# Patient Record
Sex: Male | Born: 1975 | Race: White | Hispanic: No | Marital: Married | Smoking: Current some day smoker
Health system: Southern US, Community
[De-identification: ages and names within clinical notes are randomized; demographics above are authoritative.]

## PROBLEM LIST (undated history)

## (undated) DIAGNOSIS — F329 Major depressive disorder, single episode, unspecified: Secondary | ICD-10-CM

## (undated) DIAGNOSIS — F32A Depression, unspecified: Secondary | ICD-10-CM

---

## 2017-03-01 ENCOUNTER — Encounter (HOSPITAL_BASED_OUTPATIENT_CLINIC_OR_DEPARTMENT_OTHER): Payer: Self-pay | Admitting: Emergency Medicine

## 2017-03-01 ENCOUNTER — Other Ambulatory Visit: Payer: Self-pay

## 2017-03-01 ENCOUNTER — Emergency Department (HOSPITAL_BASED_OUTPATIENT_CLINIC_OR_DEPARTMENT_OTHER): Payer: BLUE CROSS/BLUE SHIELD

## 2017-03-01 ENCOUNTER — Emergency Department (HOSPITAL_BASED_OUTPATIENT_CLINIC_OR_DEPARTMENT_OTHER)
Admission: EM | Admit: 2017-03-01 | Discharge: 2017-03-01 | Disposition: A | Discharge status: Home / Self Care | Payer: BLUE CROSS/BLUE SHIELD | Attending: Emergency Medicine | Admitting: Emergency Medicine

## 2017-03-01 DIAGNOSIS — S0990XA Unspecified injury of head, initial encounter: Secondary | ICD-10-CM | POA: Diagnosis present

## 2017-03-01 DIAGNOSIS — Y999 Unspecified external cause status: Secondary | ICD-10-CM | POA: Diagnosis not present

## 2017-03-01 DIAGNOSIS — F17200 Nicotine dependence, unspecified, uncomplicated: Secondary | ICD-10-CM | POA: Diagnosis not present

## 2017-03-01 DIAGNOSIS — W2209XA Striking against other stationary object, initial encounter: Secondary | ICD-10-CM | POA: Insufficient documentation

## 2017-03-01 DIAGNOSIS — H538 Other visual disturbances: Secondary | ICD-10-CM | POA: Insufficient documentation

## 2017-03-01 DIAGNOSIS — Z79899 Other long term (current) drug therapy: Secondary | ICD-10-CM | POA: Diagnosis not present

## 2017-03-01 DIAGNOSIS — S060X0A Concussion without loss of consciousness, initial encounter: Secondary | ICD-10-CM | POA: Insufficient documentation

## 2017-03-01 DIAGNOSIS — Y939 Activity, unspecified: Secondary | ICD-10-CM | POA: Insufficient documentation

## 2017-03-01 DIAGNOSIS — Y929 Unspecified place or not applicable: Secondary | ICD-10-CM | POA: Diagnosis not present

## 2017-03-01 HISTORY — DX: Major depressive disorder, single episode, unspecified: F32.9

## 2017-03-01 HISTORY — DX: Depression, unspecified: F32.A

## 2017-03-01 MED ORDER — DEXAMETHASONE SODIUM PHOSPHATE 10 MG/ML IJ SOLN
10.0000 mg | Freq: Once | INTRAMUSCULAR | Status: AC
  Administered 2017-03-01: 10 mg via INTRAMUSCULAR
  Filled 2017-03-01: qty 1

## 2017-03-01 MED ORDER — KETOROLAC TROMETHAMINE 60 MG/2ML IM SOLN
60.0000 mg | Freq: Once | INTRAMUSCULAR | Status: AC
  Administered 2017-03-01: 60 mg via INTRAMUSCULAR
  Filled 2017-03-01: qty 2

## 2017-03-01 NOTE — ED Provider Notes (Signed)
[facility] EMERGENCY DEPARTMENT Provider Note   CSN: [id number] Arrival date & time: 03/01/17  1345  History   Chief Complaint Chief Complaint  Patient presents with  . Headache    HPI Tanner Salazar is a 41 y.o. male.  HPI   Patient to the ER with complaints of headache and blurry vision since Saturday. He was bending over to get something but when he stood up he hit the back of his head on the counter. He did not have loc but his vision did go black briefly. He stayed in bed most of Sunday, he missed work yesterday because of a migraine and then today he is having photophobia and continued headache.  He has no hx of head injury, denies laceration, confusion, falling    Past Medical History:  Diagnosis Date  . Depression     There are no active problems to display for this patient.   History reviewed. No pertinent surgical history.     Home Medications    Prior to Admission medications   Medication Sig Start Date End Date Taking? Authorizing Provider  clonazePAM (KLONOPIN) 1 MG tablet Take 1 mg by mouth 2 (two) times daily.   Yes [provider]  venlafaxine (EFFEXOR) 100 MG tablet Take 100 mg by mouth 2 (two) times daily.   Yes [provider]    Family History No family history on file.  Social History Social History   Tobacco Use  . Smoking status: Current Some Day Smoker  . Smokeless tobacco: Never Used  Substance Use Topics  . Alcohol use: Yes    Frequency: Never  . Drug use: No     Allergies   Patient has no known allergies.   Review of Systems Review of Systems The patient denies anorexia, fever, weight loss,, vision loss, decreased hearing, hoarseness, chest pain, syncope, dyspnea on exertion, peripheral edema, balance deficits, hemoptysis, abdominal pain, melena, hematochezia, severe indigestion/heartburn, hematuria, incontinence, genital sores, muscle weakness, suspicious skin lesions, transient blindness,  difficulty walking, depression, unusual weight change, abnormal bleeding, enlarged lymph nodes, angioedema, and breast masses.   Physical Exam Updated Vital Signs BP 120/85 (BP Location: Left Arm)   Pulse 69   Temp 98.2 F (36.8 C) (Oral)   Resp 16   SpO2 100%   Physical Exam  Constitutional: He appears well-developed and well-nourished. No distress.  HENT:  Head: Normocephalic and atraumatic.  Eyes: Conjunctivae and EOM are normal. Pupils are equal, round, and reactive to light. No scleral icterus.  Neck: Normal range of motion. Neck supple.  Cardiovascular: Normal rate, regular rhythm, normal heart sounds and intact distal pulses.  Pulmonary/Chest: Effort normal. No respiratory distress. He has no wheezes. He has no rales. He exhibits no tenderness.  Abdominal: Soft. There is no tenderness.  Musculoskeletal: He exhibits no edema or tenderness.  Neurological: He is alert. He displays a negative Romberg sign.  No facial paralysis or slurring of speech, patient is alert and oriented to self. Unable to asses cranial nerves d/t pts inability to follow commands.    Skin: Skin is warm and dry. No petechiae, no purpura and no rash noted. He is not diaphoretic.  Psychiatric: His speech is not slurred. Cognition and memory are not impaired.  Nursing note and vitals reviewed.    ED Treatments / Results  Labs (all labs ordered are listed, but only abnormal results are displayed) Labs Reviewed - No data to display  EKG  EKG Interpretation None  Radiology Ct Head Wo Contrast  Result Date: 03/01/2017 CLINICAL DATA:  Frontal headache with photophobia after hitting head on cabinet this past weekend. EXAM: CT HEAD WITHOUT CONTRAST TECHNIQUE: Contiguous axial images were obtained from the base of the skull through the vertex without intravenous contrast. COMPARISON:  None. FINDINGS: Brain: No evidence of acute infarction, hemorrhage, hydrocephalus, extra-axial collection or mass  lesion/mass effect. Normal variant cavum septi pellucidi accounting for the asymmetric appearance of the lateral ventricles. Vascular: No hyperdense vessel or unexpected calcification. Skull: Normal. Negative for fracture or focal lesion. Sinuses/Orbits: No acute finding. Other: None IMPRESSION: No acute intracranial abnormality. Electronically Signed   By: Tollie Ethavid  Kwon M.D.   On: 03/01/2017 16:08    Procedures Procedures (including critical care time)  Medications Ordered in ED Medications  dexamethasone (DECADRON) injection 10 mg (10 mg Intramuscular Given 03/01/17 1454)  ketorolac (TORADOL) injection 60 mg (60 mg Intramuscular Given 03/01/17 1454)     Initial Impression / Assessment and Plan / ED Course  I have reviewed the triage vital signs and the nursing notes.  Pertinent labs & imaging results that were available during my care of the patient were reviewed by me and considered in my medical decision making (see chart for details).    2:50pm Tanner NeedleMichael is having a post traumatic headache, he is 4 days out and likely has a mild/moderate concussion but due to the photophobia and continued migraine I will obtain a head CT. Decadron and Toradol injections given in ED. Visual Acuity test ordered.  4:18pm Normal Head Ct, clinically he has a concussion. No driving and no operating heavy machinery for now. Work note given.  Aspirin, Tylenol or Motrin for pain. Rest  I will give follow-up information for neurology if his symptoms persist past Monday he has been instructed to call and schedule an appointment.  Blood pressure 120/85, pulse 69, temperature 98.2 F (36.8 C), temperature source Oral, resp. rate 16, SpO2 100 %.  Tanner Salazar has been evaluated today in the emergency department. The appropriate screening and testing was been performed and I believe the patient to be medically stable for discharge.   Return signs and symptoms have been discussed with the patient and/or  caregivers and they have voiced their understanding. The patient has agreed to follow-up with their primary care provider or the referred specialist.   Final Clinical Impressions(s) / ED Diagnoses   Final diagnoses:  Concussion without loss of consciousness, initial encounter    ED Discharge Orders    None       Marlon PelGreene, Adeleigh Barletta, PA-C 03/01/17 1619    Loren RacerYelverton, David, MD 03/02/17 1345

## 2017-03-01 NOTE — ED Triage Notes (Signed)
Pt reports hitting head on a cabinet on Saturday. Reports today he is having a frontal headache with photophobia.

## 2019-03-31 ENCOUNTER — Other Ambulatory Visit: Payer: Self-pay

## 2019-03-31 ENCOUNTER — Emergency Department (INDEPENDENT_AMBULATORY_CARE_PROVIDER_SITE_OTHER): Payer: BC Managed Care – PPO

## 2019-03-31 ENCOUNTER — Emergency Department
Admission: EM | Admit: 2019-03-31 | Discharge: 2019-03-31 | Disposition: A | Discharge status: Home / Self Care | Payer: BC Managed Care – PPO | Source: Home / Self Care | Attending: Family Medicine | Admitting: Family Medicine

## 2019-03-31 DIAGNOSIS — M25532 Pain in left wrist: Secondary | ICD-10-CM | POA: Diagnosis not present

## 2019-03-31 DIAGNOSIS — M778 Other enthesopathies, not elsewhere classified: Secondary | ICD-10-CM

## 2019-03-31 DIAGNOSIS — M654 Radial styloid tenosynovitis [de Quervain]: Secondary | ICD-10-CM

## 2019-03-31 MED ORDER — PREDNISONE 20 MG PO TABS: ORAL_TABLET | ORAL | 0 refills | Status: AC

## 2019-03-31 NOTE — ED Provider Notes (Signed)
[facility]    CSN: [id number] Arrival date & time: 03/31/19  1048      History   Chief Complaint No chief complaint on file.   HPI Tanner Salazar is a 43 y.o. male.   Patient awoke yesterday morning with pain in his let wrist.  He recalls no injury.  The history is provided by the patient.  Wrist Pain This is a new problem. The current episode started yesterday. The problem occurs constantly. The problem has not changed since onset.Exacerbated by: wrist movement. Nothing relieves the symptoms. Treatments tried: ice pack. The treatment provided mild relief.    Past Medical History:  Diagnosis Date  . Depression     There are no problems to display for this patient.   History reviewed. No pertinent surgical history.     Home Medications    Prior to Admission medications   Medication Sig Start Date End Date Taking? Authorizing Provider  clonazePAM (KLONOPIN) 1 MG tablet Take 1 mg by mouth 2 (two) times daily.    [provider]  predniSONE (DELTASONE) 20 MG tablet Take one tab by mouth twice daily for 4 days, then one daily. Take with food. 03/31/19   Tanner Haw, MD  venlafaxine (EFFEXOR) 100 MG tablet Take 100 mg by mouth 2 (two) times daily.    [provider]    Family History History reviewed. No pertinent family history.  Social History Social History   Tobacco Use  . Smoking status: Current Some Day Smoker  . Smokeless tobacco: Never Used  Substance Use Topics  . Alcohol use: Yes  . Drug use: No     Allergies   Patient has no known allergies.   Review of Systems Review of Systems  All other systems reviewed and are negative.    Physical Exam Triage Vital Signs ED Triage Vitals [03/31/19 1310]  Enc Vitals Group     BP (!) 139/95     Pulse Rate 89     Resp 18     Temp 98.4 F (36.9 C)     Temp Source Oral     SpO2 98 %     Weight 200 lb (90.7 kg)     Height 5\' 9"  (1.753 m)     Head Circumference       Peak Flow      Pain Score 5     Pain Loc      Pain Edu?      Excl. in GC?    No data found.  Updated Vital Signs BP (!) 139/95 (BP Location: Right Arm)   Pulse 89   Temp 98.4 F (36.9 C) (Oral)   Resp 18   Ht 5\' 9"  (1.753 m)   Wt 90.7 kg   SpO2 98%   BMI 29.53 kg/m   Visual Acuity Right Eye Distance:   Left Eye Distance:   Bilateral Distance:    Right Eye Near:   Left Eye Near:    Bilateral Near:     Physical Exam Vitals and nursing note reviewed.  Constitutional:      General: He is not in acute distress. HENT:     Head: Normocephalic.     Nose: Nose normal.  Eyes:     Pupils: Pupils are equal, round, and reactive to light.  Cardiovascular:     Rate and Rhythm: Normal rate.  Pulmonary:     Effort: Pulmonary effort is normal.  Musculoskeletal:     Left wrist:  Tenderness present. No swelling, bony tenderness, snuff box tenderness or crepitus. Normal range of motion.       Hands:     Comments: Left wrist has tenderness to palpation over extensor tendons dorsally.  Pain is elicited by resisted finger extension.  Left thumb has distinct tenderness over the extensor tendons.  Palpation there during resisted extension and abduction of the thumb recreates his pain.  Distal neurovascular function is intact.    Skin:    General: Skin is warm and dry.  Neurological:     Mental Status: He is alert.      UC Treatments / Results  Labs (all labs ordered are listed, but only abnormal results are displayed) Labs Reviewed - No data to display  EKG   Radiology DG Wrist Complete Left  Result Date: 03/31/2019 CLINICAL DATA:  Wrist pain with dorsal soft tissue lump.  No injury. EXAM: LEFT WRIST - COMPLETE 3+ VIEW COMPARISON:  None. FINDINGS: There is no evidence of fracture or dislocation. There is no evidence of arthropathy or other focal bone abnormality. Soft tissues are unremarkable. IMPRESSION: Negative. Electronically Signed   By: Tanner Salazar M.D.   On:  03/31/2019 13:04    Procedures Procedures (including critical care time)  Medications Ordered in UC Medications - No data to display  Initial Impression / Assessment and Plan / UC Course  I have reviewed the triage vital signs and the nursing notes.  Pertinent labs & imaging results that were available during my care of the patient were reviewed by me and considered in my medical decision making (see chart for details).    Dispensed wrist splint.  Begin prednisone burst/taper. Followup with Dr. Aundria Salazar ([facility]) if not improving about two weeks.    Final Clinical Impressions(s) / UC Diagnoses   Final diagnoses:  Left wrist tendonitis  De Quervain's tenosynovitis, left     Discharge Instructions     Apply ice pack for 20 to 30 minutes, 3 to 4 times daily  Continue until pain and swelling decrease.  Wear splint for about 7 to 10 days.  May take Tylenol as needed for pain.  After finishing prednisone, may begin Ibuprofen 200mg , 4 tabs every 8 hours with food.  Begin range of motion and stretching exercises as tolerated.    ED Prescriptions    Medication Sig Dispense Auth. Provider   predniSONE (DELTASONE) 20 MG tablet Take one tab by mouth twice daily for 4 days, then one daily. Take with food. 12 tablet Tanner Nicolas, MD        Tanner Nicolas, MD 04/10/19 [phone]

## 2019-03-31 NOTE — Discharge Instructions (Addendum)
Apply ice pack for 20 to 30 minutes, 3 to 4 times daily  Continue until pain and swelling decrease.  Wear splint for about 7 to 10 days.  May take Tylenol as needed for pain.  After finishing prednisone, may begin Ibuprofen 200mg , 4 tabs every 8 hours with food.  Begin range of motion and stretching exercises as tolerated.

## 2019-03-31 NOTE — ED Triage Notes (Signed)
Woke up yesterday with Lt wrist pain and swelling. Icing as needed. Started yesterday. Ltd ROM. Pain 5/10

## 2020-06-19 IMAGING — DX DG WRIST COMPLETE 3+V*L*
4 series · 4 of 4 positions shown · non-contrast
Comparison: None.

CLINICAL DATA: Wrist pain with dorsal soft tissue lump.  No injury.

EXAM:
LEFT WRIST - COMPLETE 3+ VIEW

[wrist pa]
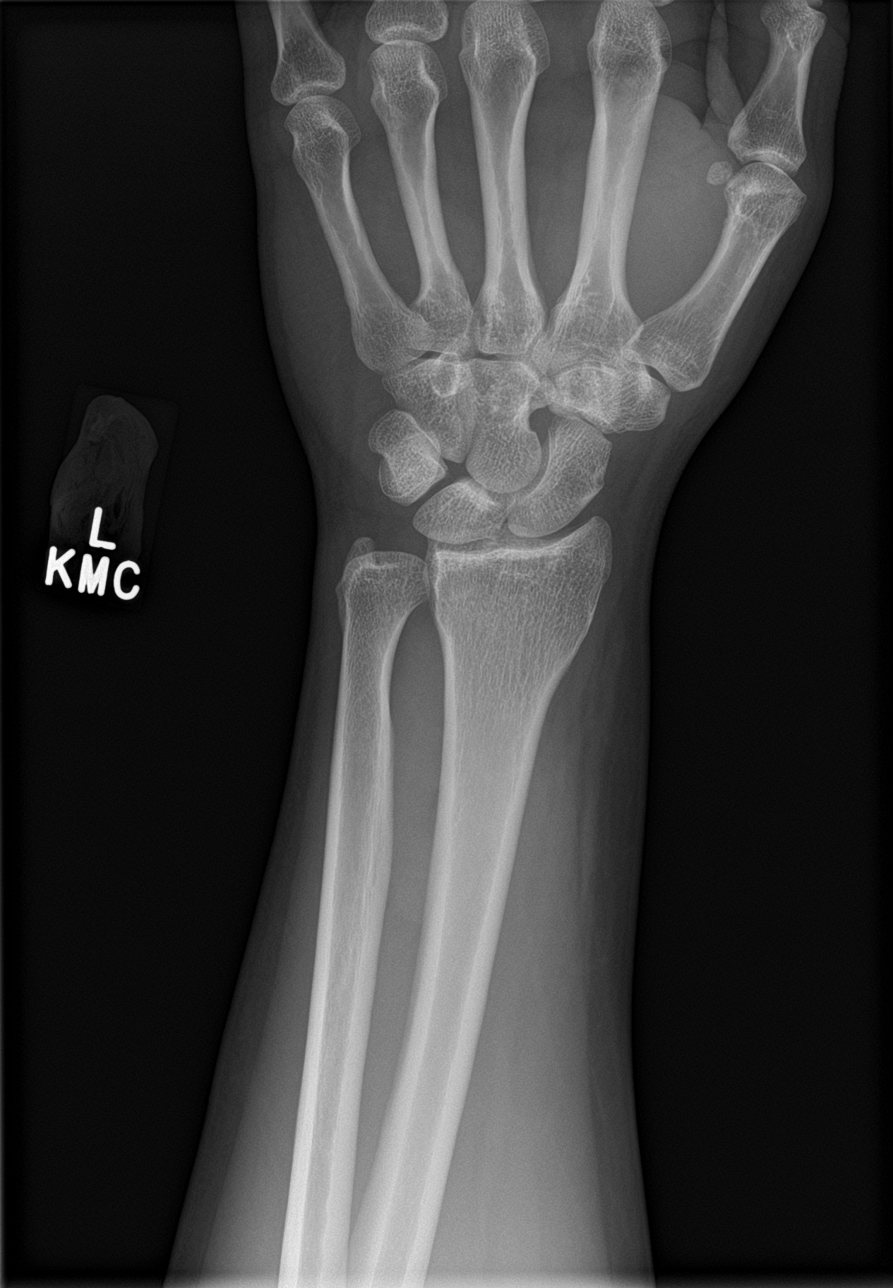

[wrist obl]
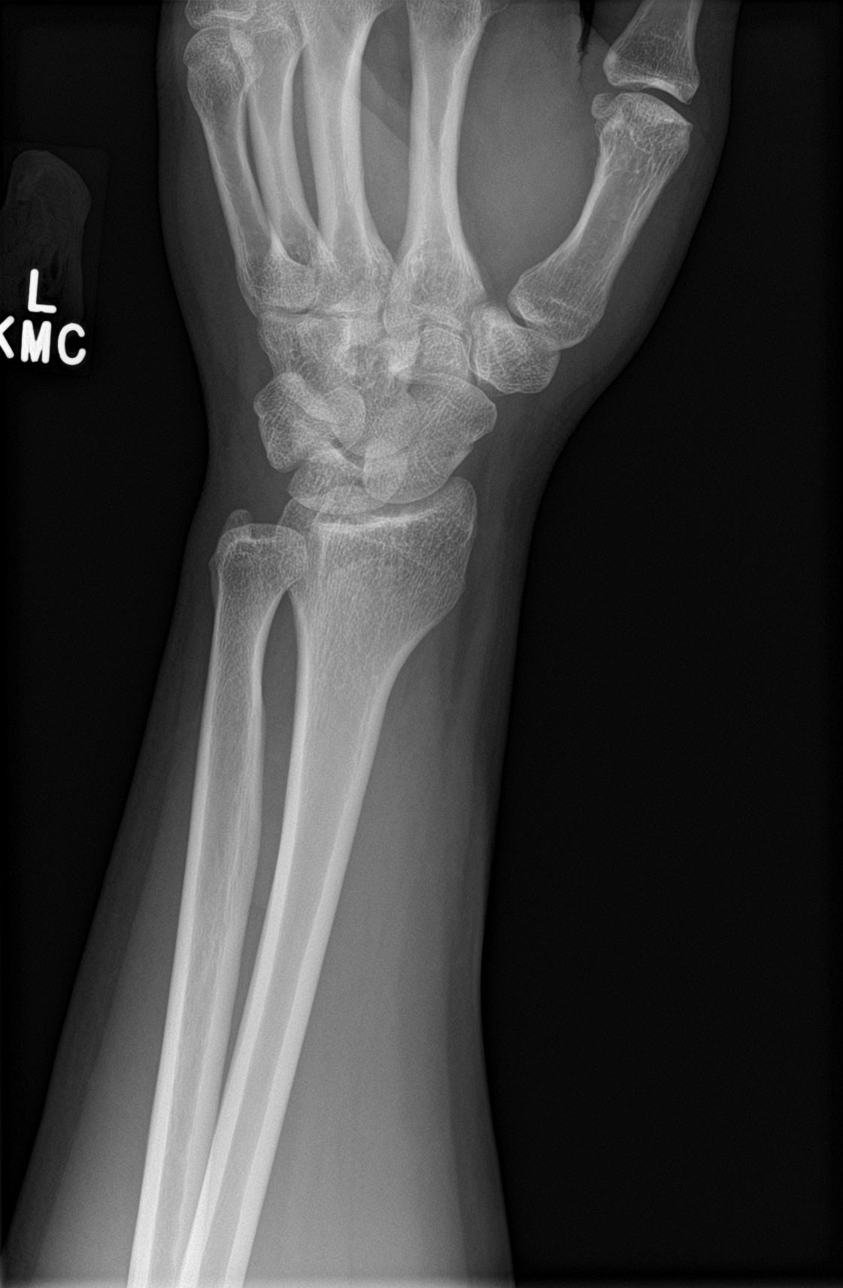

[wrist lat]
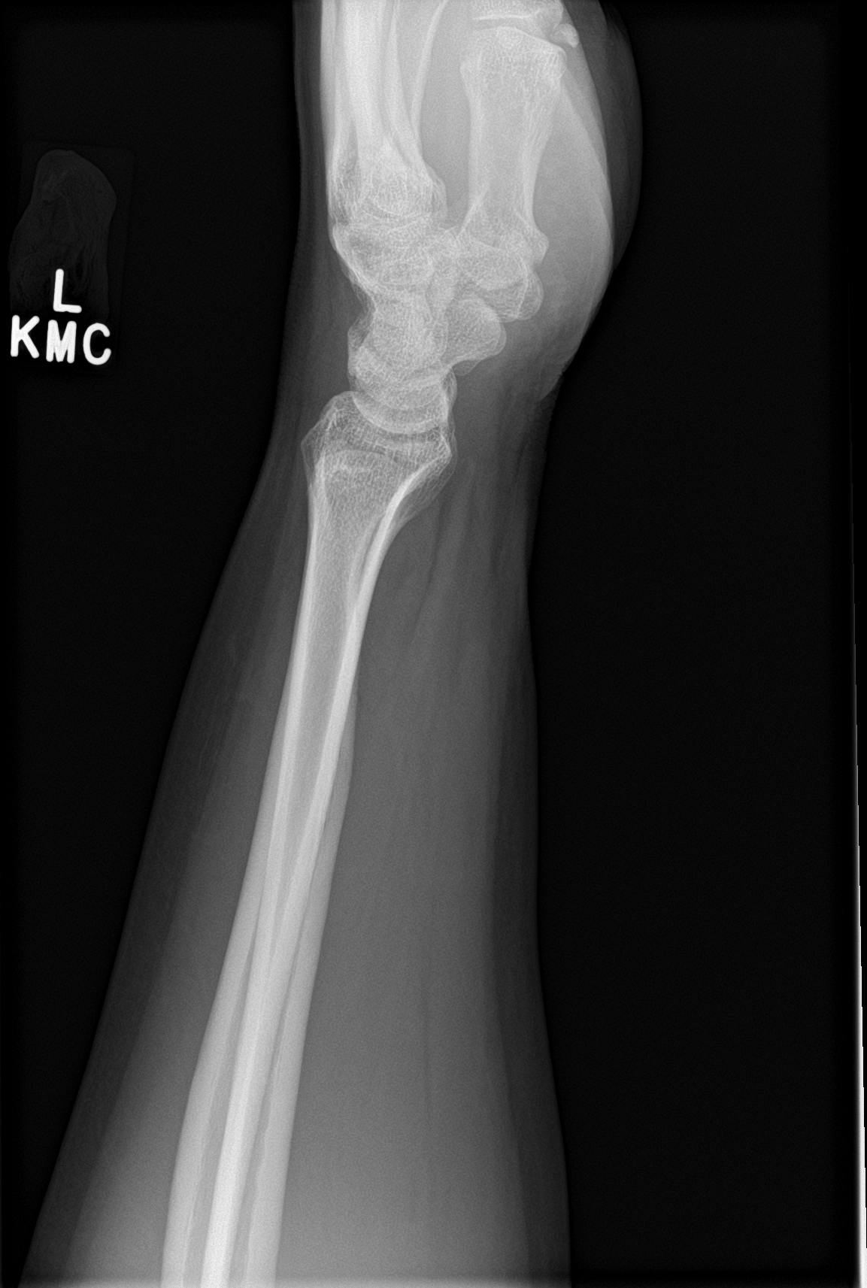

[wrist navicular]
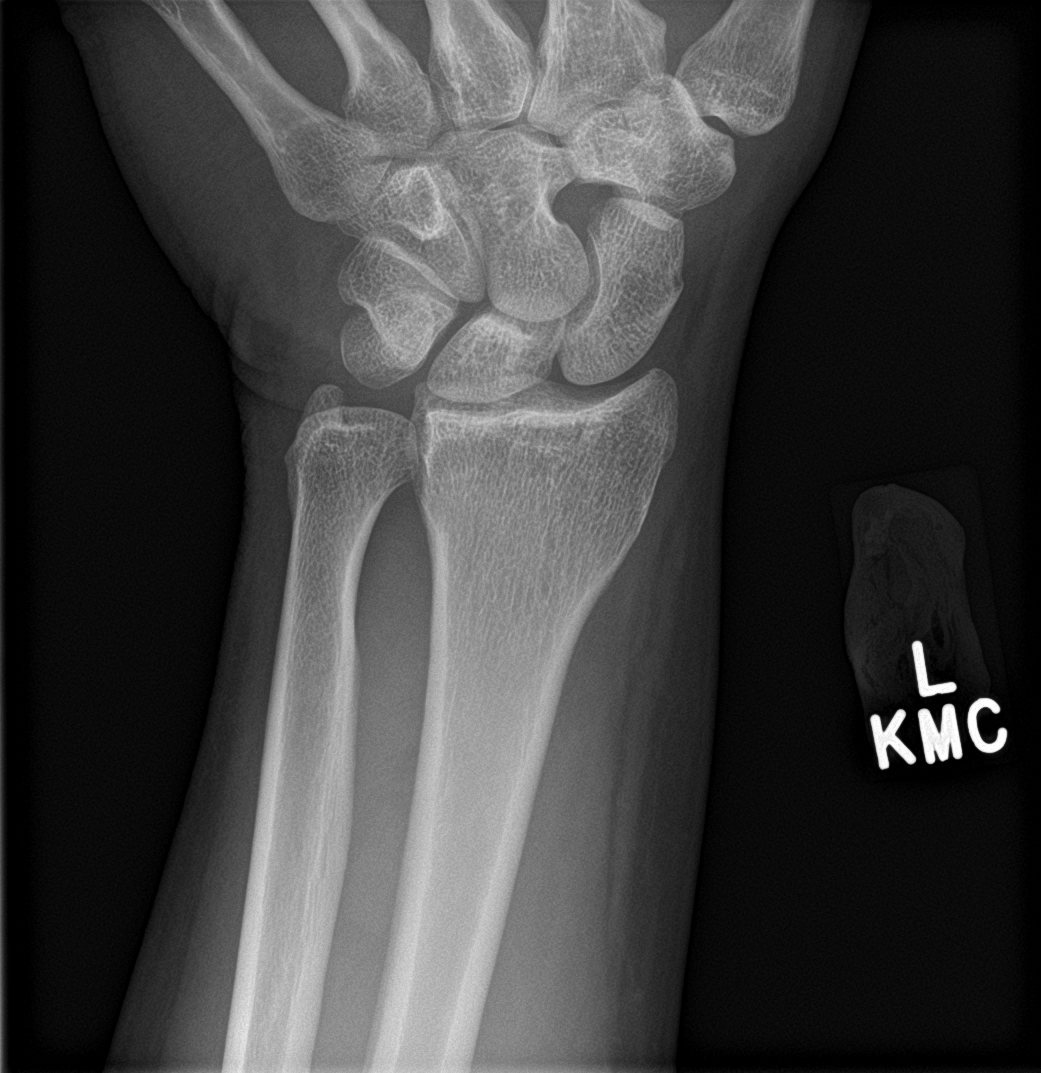

[4 of 4 positions shown; findings below may reference images not displayed]

FINDINGS: There is no evidence of fracture or dislocation. There is no
evidence of arthropathy or other focal bone abnormality. Soft
tissues are unremarkable.
IMPRESSION: Negative.
